# Patient Record
Sex: Female | Born: 1980 | Hispanic: No | Marital: Married | Smoking: Never smoker
Health system: Southern US, Community
[De-identification: ages and names within clinical notes are randomized; demographics above are authoritative.]

## PROBLEM LIST (undated history)

## (undated) DIAGNOSIS — D369 Benign neoplasm, unspecified site: Secondary | ICD-10-CM

## (undated) HISTORY — PX: OTHER SURGICAL HISTORY: SHX169

## (undated) HISTORY — PX: BRAIN SURGERY: SHX531

---

## 2003-08-13 ENCOUNTER — Inpatient Hospital Stay (HOSPITAL_COMMUNITY): Admission: AD | Admit: 2003-08-13 | Discharge: 2003-08-15 | Payer: Self-pay | Admitting: Obstetrics and Gynecology

## 2006-01-14 ENCOUNTER — Emergency Department (HOSPITAL_COMMUNITY): Admission: EM | Admit: 2006-01-14 | Discharge: 2006-01-14 | Payer: Self-pay | Admitting: Emergency Medicine

## 2006-12-29 ENCOUNTER — Inpatient Hospital Stay (HOSPITAL_COMMUNITY): Admission: RE | Admit: 2006-12-29 | Discharge: 2006-12-31 | Payer: Self-pay | Admitting: Obstetrics and Gynecology

## 2013-10-26 ENCOUNTER — Encounter: Payer: Self-pay | Admitting: Orthopedic Surgery

## 2013-10-26 ENCOUNTER — Ambulatory Visit (INDEPENDENT_AMBULATORY_CARE_PROVIDER_SITE_OTHER): Payer: BC Managed Care – PPO

## 2013-10-26 ENCOUNTER — Ambulatory Visit (INDEPENDENT_AMBULATORY_CARE_PROVIDER_SITE_OTHER): Payer: BC Managed Care – PPO | Admitting: Orthopedic Surgery

## 2013-10-26 VITALS — BP 110/69 | Ht 65.0 in | Wt 143.0 lb

## 2013-10-26 DIAGNOSIS — M25579 Pain in unspecified ankle and joints of unspecified foot: Secondary | ICD-10-CM

## 2013-10-26 DIAGNOSIS — S8263XA Displaced fracture of lateral malleolus of unspecified fibula, initial encounter for closed fracture: Secondary | ICD-10-CM

## 2013-10-26 MED ORDER — ACETAMINOPHEN-CODEINE #3 300-30 MG PO TABS: 1.0000 | ORAL_TABLET | ORAL | Status: DC | PRN

## 2013-10-26 NOTE — Progress Notes (Signed)
  Subjective:    Andrea Ochoa is a 33 y.o. female who presents with right ankle pain. Onset of the symptoms was 2 days ago. Inciting event: injured while walking at home . Current symptoms include: bruising, inability to bear weight, pain at the lateral aspect of the ankle, stiffness and swelling. Aggravating factors: direct pressure, standing, walking  and weight bearing. Symptoms have waxed and waned. Patient has had no prior ankle problems. Evaluation to date: plain films: abnormal lateral mall non displaced fracture . Treatment to date: none. The following portions of the patient's history were reviewed and updated as appropriate: allergies, current medications, past family history, past medical history, past social history, past surgical history and problem list.   ros is positive for fatigue and cough and anxiety patient just   delivered 2 months ago has a 6-month-old and she is breast-feeding  Exam:   BP 110/69  Ht 5\' 5"  (1.651 m)  Wt 143 lb (64.864 kg)  BMI 23.80 kg/m2 General appearance is normal she is oriented x3 mood and affect are flat ambulation nonweightbearing with crutches  Shows a swollen tender ankle I can get her foot to neutral she should be okay for Cam Walker weightbearing as tolerated ankle is stable tenderness over the lateral malleolus minimal tenderness over the medial deltoid motor exam is normal skin is intact strong dorsalis pedis pulse normal sensation and no lymphadenopathy  Right ankle:   c above   Left ankle:   normal   Imaging:  AP and lateral sent from the chiropractor's office show a lateral malleolus fracture. Repeat films including a mortise view shows a stable intact mortise the lateral malleolus fracture  Assessment:    Fracture of Lateral malleolus    Plan:    Recommend weightbearing as tolerated and a long Cam Walker with crutches x-ray in 6 weeks out of work 6 weeks to discuss medication use with pediatrician or a gynecologist regarding pain  management

## 2013-10-26 NOTE — Patient Instructions (Signed)
Weight bearing as tolerated  Remove for bathing  Ankle Fracture A fracture is a break in the bone. A cast or splint is used to protect and keep your injured bone from moving.  HOME CARE INSTRUCTIONS   Use your crutches as directed.  To lessen the swelling, keep the injured leg elevated while sitting or lying down.  Apply ice to the injury for 15-20 minutes, 03-04 times per day while awake for 2 days. Put the ice in a plastic bag and place a thin towel between the bag of ice and your cast.  If you have a plaster or fiberglass cast:  Do not try to scratch the skin under the cast using sharp or pointed objects.  Check the skin around the cast every day. You may put lotion on any red or sore areas.  Keep your cast dry and clean.  If you have a plaster splint:  Wear the splint as directed.  You may loosen the elastic around the splint if your toes become numb, tingle, or turn cold or blue.  Do not put pressure on any part of your cast or splint; it may break. Rest your cast only on a pillow the first 24 hours until it is fully hardened.  Your cast or splint can be protected during bathing with a plastic bag. Do not lower the cast or splint into water.  Take medications as directed by your caregiver. Only take over-the-counter or prescription medicines for pain, discomfort, or fever as directed by your caregiver.  Do not drive a vehicle until your caregiver specifically tells you it is safe to do so.  If your caregiver has given you a follow-up appointment, it is very important to keep that appointment. Not keeping the appointment could result in a chronic or permanent injury, pain, and disability. If there is any problem keeping the appointment, you must call back to this facility for assistance. SEEK IMMEDIATE MEDICAL CARE IF:   Your cast gets damaged or breaks.  You have continued severe pain or more swelling than you did before the cast was put on.  Your skin or toenails below  the injury turn blue or gray, or feel cold or numb.  There is a bad smell or new stains and/or purulent (pus like) drainage coming from under the cast. If you do not have a window in your cast for observing the wound, a discharge or minor bleeding may show up as a stain on the outside of your cast. Report these findings to your caregiver. MAKE SURE YOU:   Understand these instructions.  Will watch your condition.  Will get help right away if you are not doing well or get worse. Document Released: 08/22/2000 Document Revised: 11/17/2011 Document Reviewed: 03/24/2013 [facility] Patient Information 2014 [place], [place].

## 2013-12-08 ENCOUNTER — Ambulatory Visit: Payer: BC Managed Care – PPO | Admitting: Orthopedic Surgery

## 2013-12-12 ENCOUNTER — Telehealth: Payer: Self-pay | Admitting: Orthopedic Surgery

## 2013-12-12 NOTE — Telephone Encounter (Signed)
Patient called in regard to missed appointment of 12/08/13 for follow up/fracture care with Xrays of right ankle.  A letter has been done but may not have reached patient as of yet in mail.  She has re-scheduled for first available, as Dr Aline Brochure is out of office for 1 week, and is on the wait list in event of a cancellation sooner.  She mentioned being ready to return to work, however, her out of work status was entered through date of the scheduled appointment 12/08/13.  She would like to return as soon as possible.  The re-scheduled appointment is 12/26/13.  Issue out of work till this date, or may patient return for light duty now? Patient's ph# is [phone].

## 2013-12-13 NOTE — Telephone Encounter (Signed)
Keep everyhing the same

## 2013-12-14 NOTE — Telephone Encounter (Signed)
Called back to patient to relay; left voice message to relay.

## 2013-12-21 NOTE — Telephone Encounter (Signed)
On 12/20/13, I called back to patient; offered her an earlier date appointment in a cancellation slot, since she had to move her original appointment out to 12/26/13; states she would be unable to come today (12/20/13) and would keep the appointment date of 12/26/13.

## 2013-12-26 ENCOUNTER — Encounter: Payer: Self-pay | Admitting: Orthopedic Surgery

## 2013-12-26 ENCOUNTER — Ambulatory Visit (INDEPENDENT_AMBULATORY_CARE_PROVIDER_SITE_OTHER): Payer: BC Managed Care – PPO | Admitting: Orthopedic Surgery

## 2013-12-26 ENCOUNTER — Ambulatory Visit (INDEPENDENT_AMBULATORY_CARE_PROVIDER_SITE_OTHER): Payer: BC Managed Care – PPO

## 2013-12-26 VITALS — BP 110/70 | Ht 65.0 in | Wt 143.0 lb

## 2013-12-26 DIAGNOSIS — S82891A Other fracture of right lower leg, initial encounter for closed fracture: Secondary | ICD-10-CM

## 2013-12-26 DIAGNOSIS — S82899A Other fracture of unspecified lower leg, initial encounter for closed fracture: Secondary | ICD-10-CM

## 2013-12-26 DIAGNOSIS — S8263XA Displaced fracture of lateral malleolus of unspecified fibula, initial encounter for closed fracture: Secondary | ICD-10-CM

## 2013-12-26 NOTE — Progress Notes (Signed)
Patient ID: Andrea Ochoa, female   DOB: 02/17/1981, 33 y.o.   MRN: [id number] On the in her Chief Complaint  Patient presents with  . Follow-up    6 week recheck Right Fractured lateral malleolus DOI 10/24/13   Fracture care followup  Returned to work this week in  a [employer]  No pain or tenderness at this time  X-rays show fracture healing  Remove brace, weightbearing as tolerated follow up when necessary

## 2013-12-26 NOTE — Patient Instructions (Signed)
She has returned to work already

## 2015-06-05 ENCOUNTER — Other Ambulatory Visit (HOSPITAL_COMMUNITY): Payer: Self-pay | Admitting: Preventative Medicine

## 2015-06-05 DIAGNOSIS — G44029 Chronic cluster headache, not intractable: Secondary | ICD-10-CM

## 2015-06-12 ENCOUNTER — Ambulatory Visit (HOSPITAL_COMMUNITY): Payer: Self-pay

## 2015-07-13 ENCOUNTER — Ambulatory Visit (HOSPITAL_COMMUNITY): Payer: 59

## 2015-08-14 ENCOUNTER — Ambulatory Visit (HOSPITAL_COMMUNITY)
Admission: RE | Admit: 2015-08-14 | Discharge: 2015-08-14 | Disposition: A | Discharge status: Home / Self Care | Payer: 59 | Source: Ambulatory Visit | Attending: Preventative Medicine | Admitting: Preventative Medicine

## 2015-08-14 DIAGNOSIS — R930 Abnormal findings on diagnostic imaging of skull and head, not elsewhere classified: Secondary | ICD-10-CM | POA: Diagnosis not present

## 2015-08-14 DIAGNOSIS — G44029 Chronic cluster headache, not intractable: Secondary | ICD-10-CM | POA: Diagnosis not present

## 2015-08-14 MED ORDER — GADOBENATE DIMEGLUMINE 529 MG/ML IV SOLN
13.0000 mL | Freq: Once | INTRAVENOUS | Status: AC | PRN
  Administered 2015-08-14: 13 mL via INTRAVENOUS

## 2016-11-08 IMAGING — MR MR HEAD WO/W CM
6 of 13 series · 23 of 48 positions shown · IV contrast (multihance)
Comparison: None.

CLINICAL DATA: Right temporal headache.  Palpable mass.

EXAM:
MRI HEAD WITHOUT AND WITH CONTRAST
TECHNIQUE: Multiplanar, multiecho pulse sequences of the brain and surrounding
structures were obtained without and with intravenous contrast.
CONTRAST:  13mL MULTIHANCE GADOBENATE DIMEGLUMINE 529 MG/ML IV SOLN

[Series 5: T2 · axial · 5.0mm · 0.51mm/px · z∈[-94,+49]mm · 2 of 23 slices shown (1 of 2)]
[im 1/23]
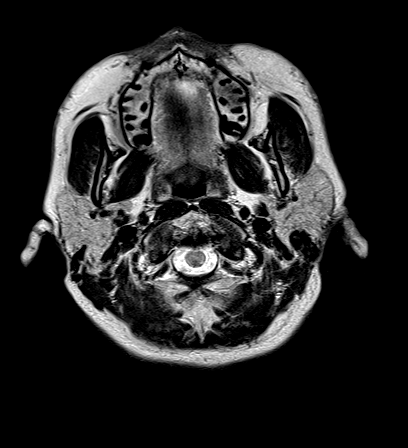
[im 23/23]
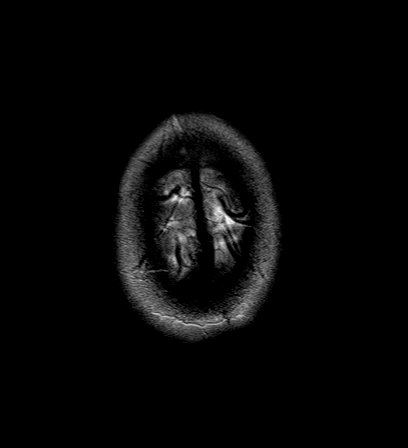

[Series 6: FLAIR · axial · 5.0mm · 0.38mm/px · z∈[-94,+48]mm · 2 of 23 slices shown]
[im 1/23]
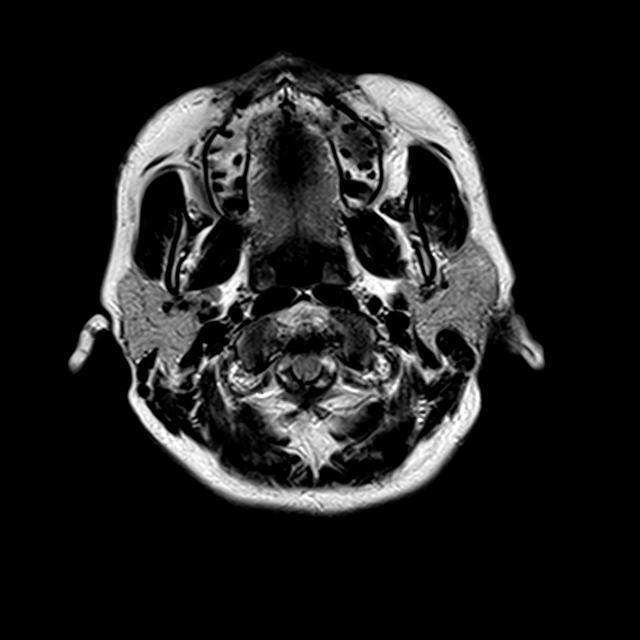
[im 23/23]
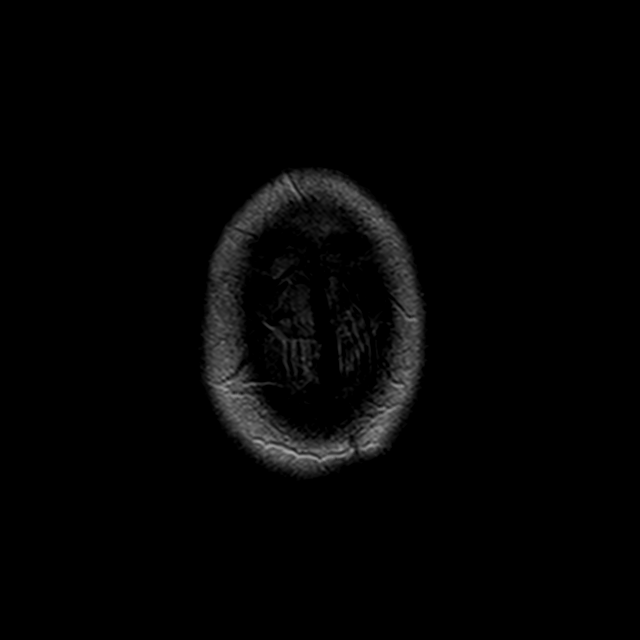

[Series 7: T1 · axial · 2.0mm · 0.45mm/px · z∈[-113,+52]mm · 7 of 98 slices shown]
[im 1/98]
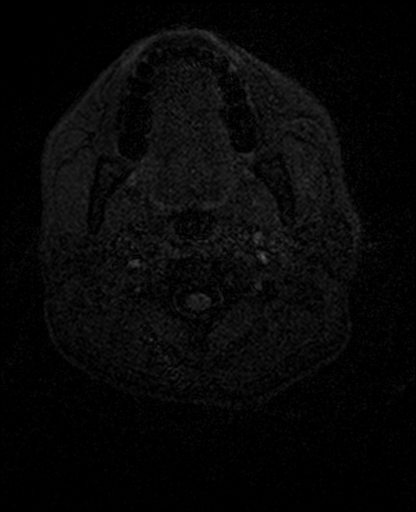
[im 14/98]
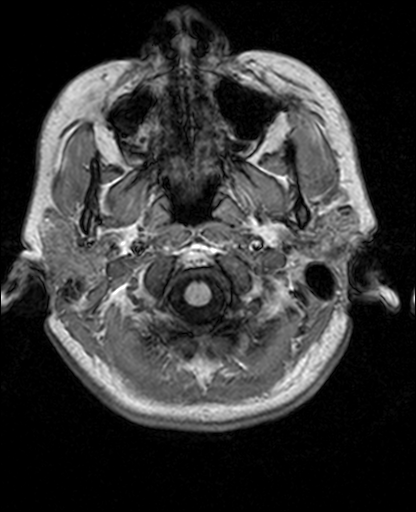
[im 28/98]
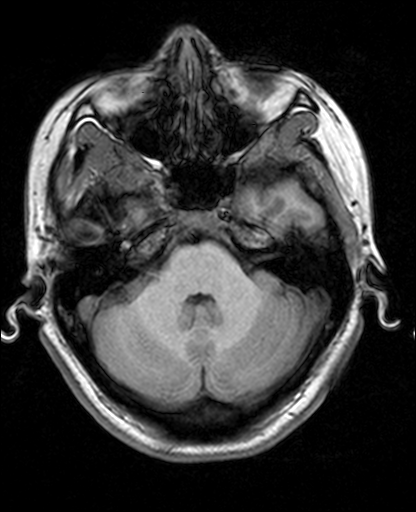
[im 42/98]
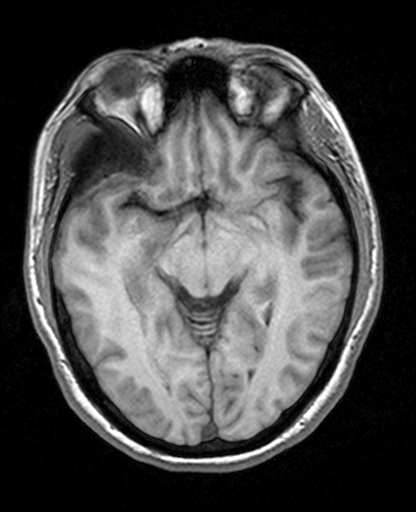
[im 56/98]
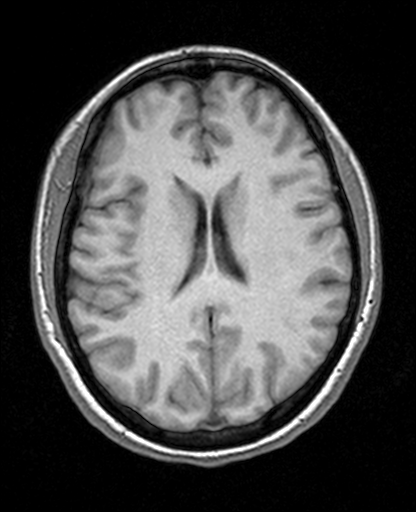
[im 70/98]
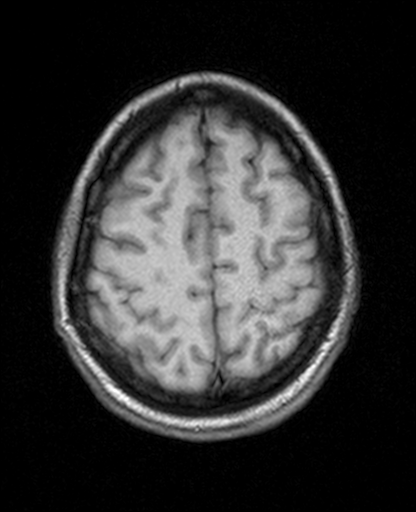
[im 84/98]
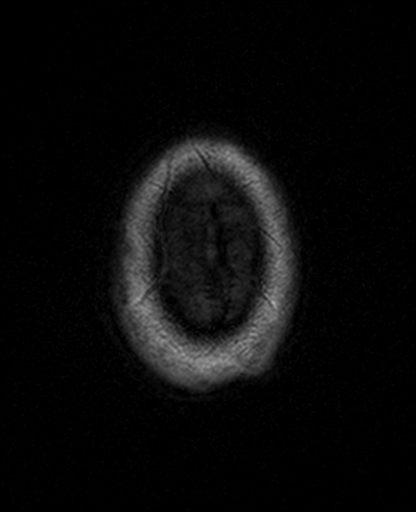

[Series 9: T2 · coronal · 5.0mm · 0.70mm/px · 2 of 24 slices shown (2 of 2)]
[im 1/24]
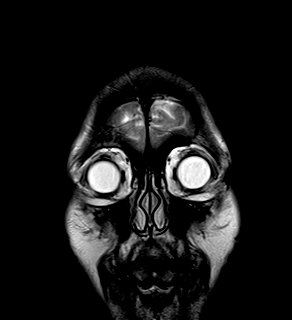
[im 24/24]
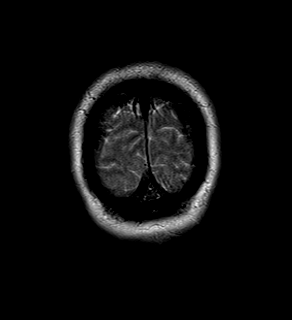

[Series 10: T1 post-contrast · axial · 2.0mm · 0.45mm/px · z∈[-113,+80]mm · 8 of 98 slices shown (1 of 2)]
[im 1/98]
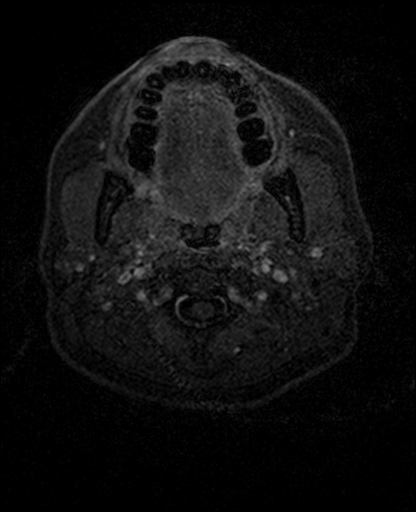
[im 14/98]
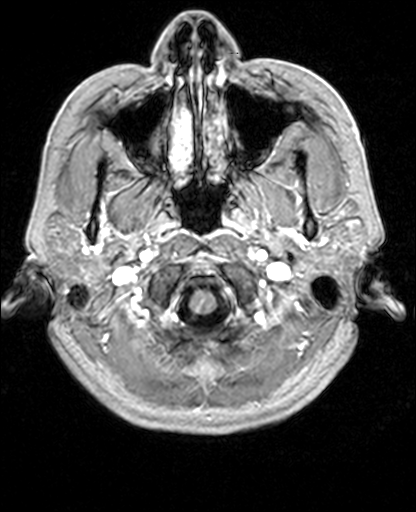
[im 28/98]
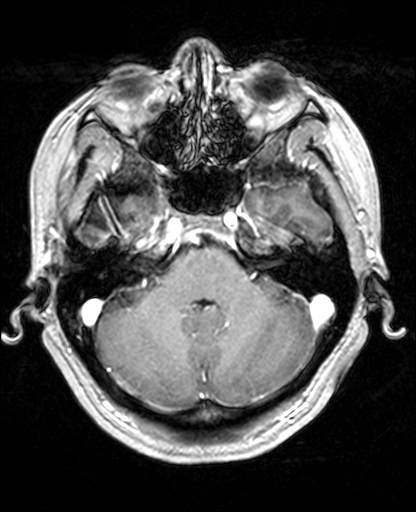
[im 42/98]
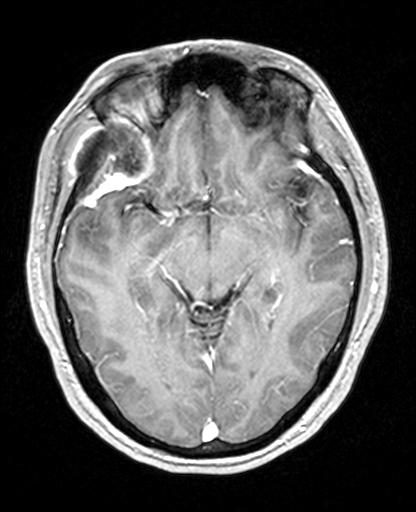
[im 56/98]
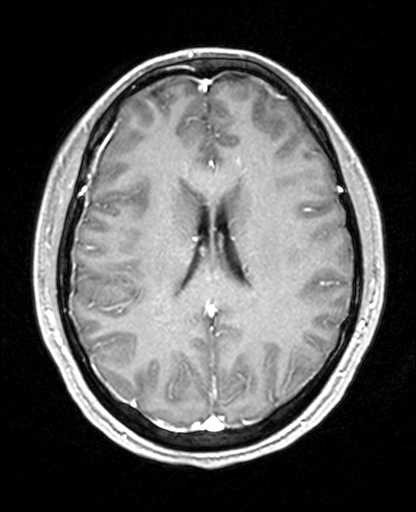
[im 70/98]
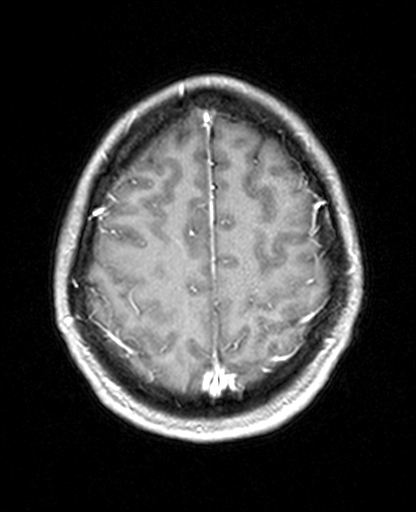
[im 84/98]
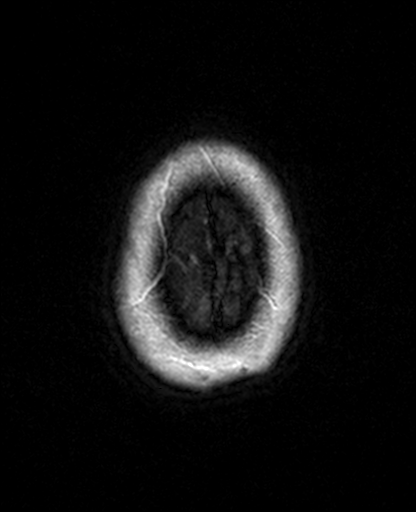
[im 98/98]
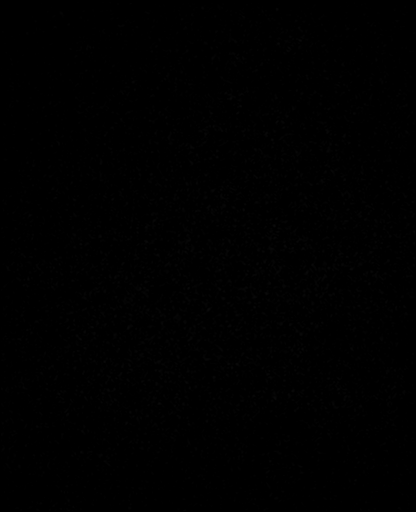

[Series 11: T1 post-contrast · coronal · 5.0mm · 0.44mm/px · 2 of 24 slices shown (2 of 2)]
[im 1/24]
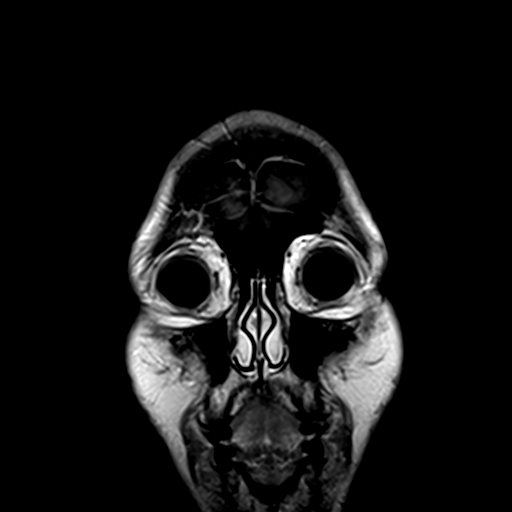
[im 24/24]
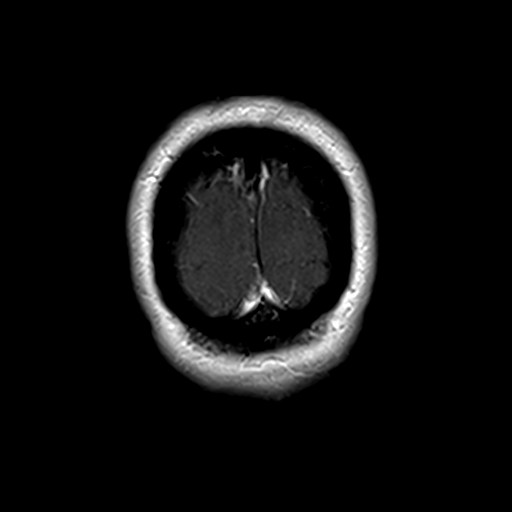

[23 of 48 positions shown; findings below may reference images not displayed]

FINDINGS: There is bony thickening of the greater wing of the sphenoid on the
right extending into the soft tissues of the temporalis muscle as
well as into the middle cranial fossa. There is also some thickening
of the orbital roof. Postcontrast reveals mild enhancement of the
thickened bone. There is plaque-like enhancement of the dura in the
area of the greater wing of the sphenoid measuring approximately 6 x
16 mm. No edema in the adjacent brain. There is mass-effect on the
adjacent brain.

Ventricle size normal.  No shift of the midline structures.

Negative for acute infarct.  No significant chronic ischemia.

Postcontrast imaging reveals no additional areas of abnormal
enhancement.

Mild mucosal edema in the paranasal sinuses. Normal orbit. Pituitary
normal in size
IMPRESSION: Bony thickening of the greater wing of the sphenoid on the right
extending into the right temporalis muscle. The bone shows mild
enhancement postcontrast. There is a 6 x 16 mm area of plaque-like
dural enhancement medial and posterior to the area of bony
thickening. This likely represents meningioma with intraosseous
component. Primary bone neoplasm less likely. Recommend CT of the
head without with contrast to evaluate for any possible aggressive
bony characteristics.

## 2017-10-09 ENCOUNTER — Encounter (HOSPITAL_COMMUNITY): Payer: Self-pay

## 2017-10-09 ENCOUNTER — Emergency Department (HOSPITAL_COMMUNITY)
Admission: EM | Admit: 2017-10-09 | Discharge: 2017-10-09 | Disposition: A | Discharge status: Home / Self Care | Payer: Self-pay | Attending: Emergency Medicine | Admitting: Emergency Medicine

## 2017-10-09 DIAGNOSIS — J111 Influenza due to unidentified influenza virus with other respiratory manifestations: Secondary | ICD-10-CM | POA: Insufficient documentation

## 2017-10-09 DIAGNOSIS — R69 Illness, unspecified: Secondary | ICD-10-CM

## 2017-10-09 MED ORDER — OSELTAMIVIR PHOSPHATE 75 MG PO CAPS: 75.0000 mg | ORAL_CAPSULE | Freq: Two times a day (BID) | ORAL | 0 refills | Status: AC

## 2017-10-09 NOTE — ED Provider Notes (Signed)
[facility] EMERGENCY DEPARTMENT Provider Note   CSN: [id number] Arrival date & time: 10/09/17  2025     History   Chief Complaint Chief Complaint  Patient presents with  . Cough  . Generalized Body Aches    HPI Andrea Ochoa is a 37 y.o. female.  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  She has now developed some body aches as well.  She denies a fever, she has not had any medications prior to arrival as she found out several weeks ago that she was pregnant.  She denies abdominal pain, she does endorse coughing which is nonproductive, she has had a headache, she is concerned that she may have an infection.  She has no swelling of the legs.  No trauma, or recent surgery.  History reviewed. No pertinent past medical history.  Patient Active Problem List   Diagnosis Date Noted  . Fractured lateral malleolus 10/26/2013  . Pain in joint, ankle and foot 10/26/2013    Past Surgical History:  Procedure Laterality Date  . 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 Authorizing Provider  acetaminophen-codeine (TYLENOL #3) 300-30 MG per tablet Take 1 tablet by mouth every 4 (four) hours as needed for moderate pain. 10/26/13   Carole Civil, MD  ibuprofen (ADVIL,MOTRIN) 800 MG tablet Take 800 mg by mouth every 8 (eight) hours as needed.    [provider]    Family History Family History  Problem Relation Age of Onset  . Cancer Other        + family hx  . Anesthesia problems Neg Hx     . Broken bones Neg Hx   . Clotting disorder Neg Hx   . Collagen disease Neg Hx   . Diabetes Neg Hx   . Dislocations Neg Hx   . Osteoporosis Neg Hx   . Rheumatologic disease Neg Hx   . Scoliosis Neg Hx   . Severe sprains Neg Hx     Social History Social History   Tobacco Use  . Smoking status: Never Smoker  Substance Use Topics  . Alcohol use: No  . Drug use: No     Allergies   Patient has no known allergies.   Review of Systems Review of Systems  All other systems reviewed and are negative.    Physical Exam Updated Vital Signs BP 119/75 (BP Location: Left Arm)   Pulse (!) 111   Temp 98.3 F (36.8 C) (Oral)   Resp 20   Ht 5\' 5"  (1.651 m)   Wt 72.6 kg (160 lb)   LMP 08/12/2017   SpO2 100%   BMI 26.63 kg/m   Physical Exam  Constitutional: She appears well-developed and well-nourished. No distress.  HENT:  Head: Normocephalic and atraumatic.  Mouth/Throat: Oropharynx is clear  and moist. No oropharyngeal exudate.  Eyes: Conjunctivae and EOM are normal. Pupils are equal, round, and reactive to light. Right eye exhibits no discharge. Left eye exhibits no discharge. No scleral icterus.  Neck: Normal range of motion. Neck supple. No JVD present. No thyromegaly present.  Cardiovascular: Normal rate, regular rhythm, normal heart sounds and intact distal pulses. Exam reveals no gallop and no friction rub.  No murmur heard. Pulmonary/Chest: Effort normal and breath sounds normal. No respiratory distress. She has no wheezes. She has no rales.  Abdominal: Soft. Bowel sounds are normal. She exhibits no distension and no mass. There is no tenderness.  Musculoskeletal: Normal range of motion. She exhibits no edema or tenderness.  Lymphadenopathy:    She has no cervical adenopathy.  Neurological: She is alert. Coordination normal.  Skin: Skin is warm and dry. No rash noted. No erythema.  Psychiatric: She has a normal mood and affect. Her behavior is normal.  Nursing note  and vitals reviewed.    ED Treatments / Results  Labs (all labs ordered are listed, but only abnormal results are displayed) Labs Reviewed - No data to display   Radiology No results found.  Procedures Procedures (including critical care time)  Medications Ordered in ED Medications - No data to display   Initial Impression / Assessment and Plan / ED Course  I have reviewed the triage vital signs and the nursing notes.  Pertinent labs & imaging results that were available during my care of the patient were reviewed by me and considered in my medical decision making (see chart for details).     The patient's exam is unremarkable including her pharyngeal exam without any erythema or exudate, her abdominal exam which is nontender, her lung exam which is clear with no rales wheezing rhonchi or increased work of breathing.  She has normal oxygen saturation at 100% on room air, her pulse is between 90 and 100 and she is no edema of the legs.  Despite having recent travel to [place], she has symptoms that are far more concerning for upper respiratory infection or possibly the flu especially given her recent onset of work in a nursing facility.  We will keep her out of work, she will be started on Tamiflu due to her pregnancy in less than 24 hours of symptoms and I recommended Tylenol for body aches.  She can follow-up with the pharmacist for better recommendations on over-the-counter cough medications as needed.  I do not think she needs an x-ray nor a workup for pulmonary embolism at this time   Final Clinical Impressions(s) / ED Diagnoses   Final diagnoses:  Influenza-like illness    ED Discharge Orders        Ordered    oseltamivir (TAMIFLU) 75 MG capsule  Every 12 hours     10/09/17 1002       Noemi Chapel, MD 10/09/17 1003

## 2017-10-09 NOTE — ED Triage Notes (Signed)
edp at bedside  

## 2017-10-09 NOTE — ED Triage Notes (Signed)
Pt reports cough, chest pain with coughing, headache, and generalized body aches since last night.  Pt also says had positive home pregnancy test but hasn't seen an OBGYN yet.  Reports lmp was 08/12/2017

## 2017-10-09 NOTE — Discharge Instructions (Signed)
Return to the emergency department for increasing chest pain, difficulty breathing or high fevers.  It appears that you likely have a flu type illness which may last for the better part of 1 week.   You should talk with your pharmacist about possibilities for coughing medications that are safe in pregnancy Tamiflu, 75 mg twice a day for the next 5 days  Please obtain all of your results from medical records or have your doctors office obtain the results - share them with your doctor - you should be seen at your doctors office in the next 2 days. Call today to arrange your follow up. Take the medications as prescribed. Please review all of the medicines and only take them if you do not have an allergy to them. Please be aware that if you are taking birth control pills, taking other prescriptions, ESPECIALLY ANTIBIOTICS may make the birth control ineffective - if this is the case, either do not engage in sexual activity or use alternative methods of birth control such as condoms until you have finished the medicine and your family doctor says it is OK to restart them. If you are on a blood thinner such as COUMADIN, be aware that any other medicine that you take may cause the coumadin to either work too much, or not enough - you should have your coumadin level rechecked in next 7 days if this is the case.  ?  It is also a possibility that you have an allergic reaction to any of the medicines that you have been prescribed - Everybody reacts differently to medications and while MOST people have no trouble with most medicines, you may have a reaction such as nausea, vomiting, rash, swelling, shortness of breath. If this is the case, please stop taking the medicine immediately and contact your physician.  ?  You should return to the ER if you develop severe or worsening symptoms.

## 2017-11-19 ENCOUNTER — Other Ambulatory Visit: Payer: Self-pay

## 2017-11-26 ENCOUNTER — Other Ambulatory Visit (HOSPITAL_COMMUNITY): Payer: Self-pay | Admitting: Nurse Practitioner

## 2017-11-26 ENCOUNTER — Encounter (HOSPITAL_COMMUNITY): Payer: Self-pay | Admitting: Nurse Practitioner

## 2017-11-26 DIAGNOSIS — O289 Unspecified abnormal findings on antenatal screening of mother: Secondary | ICD-10-CM

## 2017-12-01 ENCOUNTER — Encounter (HOSPITAL_COMMUNITY): Payer: Self-pay | Admitting: *Deleted

## 2017-12-02 ENCOUNTER — Other Ambulatory Visit (HOSPITAL_COMMUNITY): Payer: Self-pay | Admitting: *Deleted

## 2017-12-02 ENCOUNTER — Encounter (HOSPITAL_COMMUNITY): Payer: Self-pay

## 2017-12-02 ENCOUNTER — Ambulatory Visit (HOSPITAL_COMMUNITY)
Admission: RE | Admit: 2017-12-02 | Discharge: 2017-12-02 | Disposition: A | Discharge status: Home / Self Care | Payer: Self-pay | Source: Ambulatory Visit | Attending: Nurse Practitioner | Admitting: Nurse Practitioner

## 2017-12-02 ENCOUNTER — Other Ambulatory Visit: Payer: Self-pay

## 2017-12-02 ENCOUNTER — Other Ambulatory Visit (HOSPITAL_COMMUNITY): Payer: Self-pay | Admitting: Nurse Practitioner

## 2017-12-02 DIAGNOSIS — Z3689 Encounter for other specified antenatal screening: Secondary | ICD-10-CM

## 2017-12-02 DIAGNOSIS — O289 Unspecified abnormal findings on antenatal screening of mother: Secondary | ICD-10-CM

## 2017-12-02 DIAGNOSIS — Z3A17 17 weeks gestation of pregnancy: Secondary | ICD-10-CM

## 2017-12-02 DIAGNOSIS — O09522 Supervision of elderly multigravida, second trimester: Secondary | ICD-10-CM

## 2017-12-02 DIAGNOSIS — Z362 Encounter for other antenatal screening follow-up: Secondary | ICD-10-CM

## 2017-12-02 DIAGNOSIS — O281 Abnormal biochemical finding on antenatal screening of mother: Secondary | ICD-10-CM | POA: Insufficient documentation

## 2017-12-02 HISTORY — DX: Benign neoplasm, unspecified site: D36.9

## 2017-12-02 NOTE — Progress Notes (Signed)
Genetic Counseling  Visit Summary Note  Appointment Date: 12/02/2017 Referred By: Tylene Fantasia  Date of Birth: [birth year]  Pregnancy history: [id number] Estimated Date of Delivery: 05/10/18 Estimated Gestational Age: [redacted]w[redacted]d  Andrea Ochoa was seen for genetic counseling because of a maternal age of 37 y.o..     In summary:  Discussed increased risk for fetal aneuploidy with advanced maternal age and abnormal Quad screen  Reviewed results of patient's NIPS and ultrasound  Discussed limitations of screening and option of diagnostic testing  Declined amniocentesis  Reviewed family history concerns - no concerns identified  Discussed carrier screening options - declined  CF  SMA  Hemoglobinopathies  She was counseled regarding maternal age and the association with risk for chromosome conditions due to nondisjunction with aging of the ova.   We reviewed chromosomes, nondisjunction, and the associated 1 in 70 risk for fetal aneuploidy related to a maternal age of 37 y.o. at [redacted]w[redacted]d gestation.  She was counseled that the risk for aneuploidy decreases as gestational age increases, accounting for those pregnancies which spontaneously abort.  We specifically discussed Down syndrome (trisomy 61), trisomies 49 and 44, and sex chromosome aneuploidies (47,XXX and 47,XXY) including the common features and prognoses of each.   We also reviewed the results of Andrea Ochoa's Quad screen, non-invasive prenatal screening (NIPS) and ultrasound.  She was counseled that typically when a patient has a cell free DNA screen, an "AFP only" is ordered in the 2nd trimester to screen for open neural tube defects, but the other analytes that make up the "Quad screen" are not assessed because they screen for fetal aneuploidy, which was already performed through the NIPS.  Unfortunately, instead of an AFP only, she had a Quad screen.  She was counseled regarding the Quad screen result and the associated 1 in 142  risk for fetal Down syndrome.  We discussed that a Quad screen has a detection rate of 60-65% for Trisomy 18 and 75-80% for Down syndrome.  In addition, the Quad screen has a false positive rate of up to 5%.  We discussed that NIPS analyzes placental cell free DNA in maternal circulation to evaluate for the presence of extra chromosome conditions.  Thus, it is able to provide risk assessment for specific chromosome conditions, but still is not diagnostic.  Based on the [facility] screen, the chance for her baby to have aneuploidy for chromosomes 13, 18, 21 or X was reduced to less than 1 in 10000.  She was also counseled that 50-80% of fetuses with Down syndrome and up to 90% of fetuses with trisomies 56 and 18, when well visualized, have detectable anomalies or soft markers by ultrasound.  We discussed that her ultrasound was limited due to early gestation, but no specific concerns were identified at this time.    We also discussed the availability of diagnostic testing by way of amniocentesis.  We reviewed the risks, benefits and limitations of amniocentesis including the approximate 1 in 300-500 risk for pregnancy complications following amniocentesis. We discussed the possible results that the tests might provide including: positive, negative, unanticipated, and no result.   After reviewing the above results and the available options, Andrea Ochoa expressed that she is not interested in pursuing diagnostic testing at this time or in the future, given the associated risk of complications.  She understands that ultrasound and NIPS cannot rule out all birth defects or genetic syndromes.    Andrea Ochoa was provided with written information regarding cystic fibrosis (CF),  spinal muscular atrophy (SMA) and hemoglobinopathies including the carrier frequency, availability of carrier screening and prenatal diagnosis if indicated.  In addition, we discussed that CF and hemoglobinopathies are routinely screened for as  part of the [place] newborn screening panel.  After further discussion, she declined screening for CF, SMA and hemoglobinopathies.  Both family histories were reviewed and found to be noncontributory for birth defects, mental retardation, and known genetic conditions. Without further information regarding the provided family history, an accurate genetic risk cannot be calculated. Further genetic counseling is warranted if more information is obtained.  Andrea Ochoa denied exposure to environmental toxins or chemical agents. She denied the use of alcohol, tobacco or street drugs. She denied significant viral illnesses during the course of her pregnancy. Her medical and surgical histories were noncontributory.   Most of the counseling was provided by Lady Deutscher, [employer] genetic counseling student, under my direct supervision. We counseled Andrea Ochoa regarding the above risks and available options.  The approximate face-to-face time with the genetic counselor was 60 minutes.  Cam Hai, MS,  Certified Genetic Counselor

## 2017-12-02 NOTE — Addendum Note (Signed)
Encounter addended by: Novella Rob, RDMS on: 12/02/2017 11:12 AM  Actions taken: Imaging Exam ended

## 2017-12-31 ENCOUNTER — Ambulatory Visit (HOSPITAL_COMMUNITY)
Admission: RE | Admit: 2017-12-31 | Discharge: 2017-12-31 | Disposition: A | Discharge status: Home / Self Care | Payer: PRIVATE HEALTH INSURANCE | Source: Ambulatory Visit | Attending: Nurse Practitioner | Admitting: Nurse Practitioner

## 2017-12-31 ENCOUNTER — Encounter (HOSPITAL_COMMUNITY): Payer: Self-pay

## 2017-12-31 DIAGNOSIS — O09522 Supervision of elderly multigravida, second trimester: Secondary | ICD-10-CM | POA: Insufficient documentation

## 2017-12-31 DIAGNOSIS — Z3A21 21 weeks gestation of pregnancy: Secondary | ICD-10-CM | POA: Insufficient documentation

## 2017-12-31 DIAGNOSIS — Z362 Encounter for other antenatal screening follow-up: Secondary | ICD-10-CM | POA: Diagnosis not present

## 2018-01-16 ENCOUNTER — Emergency Department (HOSPITAL_COMMUNITY)
Admission: EM | Admit: 2018-01-16 | Discharge: 2018-01-16 | Disposition: A | Discharge status: Home / Self Care | Payer: PRIVATE HEALTH INSURANCE | Attending: Emergency Medicine | Admitting: Emergency Medicine

## 2018-01-16 ENCOUNTER — Encounter (HOSPITAL_COMMUNITY): Payer: Self-pay

## 2018-01-16 ENCOUNTER — Other Ambulatory Visit: Payer: Self-pay

## 2018-01-16 DIAGNOSIS — Y939 Activity, unspecified: Secondary | ICD-10-CM | POA: Diagnosis not present

## 2018-01-16 DIAGNOSIS — Z3A23 23 weeks gestation of pregnancy: Secondary | ICD-10-CM | POA: Insufficient documentation

## 2018-01-16 DIAGNOSIS — Y33XXXA Other specified events, undetermined intent, initial encounter: Secondary | ICD-10-CM | POA: Insufficient documentation

## 2018-01-16 DIAGNOSIS — R1031 Right lower quadrant pain: Secondary | ICD-10-CM | POA: Insufficient documentation

## 2018-01-16 DIAGNOSIS — Y998 Other external cause status: Secondary | ICD-10-CM | POA: Diagnosis not present

## 2018-01-16 DIAGNOSIS — S39011A Strain of muscle, fascia and tendon of abdomen, initial encounter: Secondary | ICD-10-CM

## 2018-01-16 DIAGNOSIS — Y92129 Unspecified place in nursing home as the place of occurrence of the external cause: Secondary | ICD-10-CM | POA: Diagnosis not present

## 2018-01-16 DIAGNOSIS — O9989 Other specified diseases and conditions complicating pregnancy, childbirth and the puerperium: Secondary | ICD-10-CM | POA: Diagnosis present

## 2018-01-16 DIAGNOSIS — Z79899 Other long term (current) drug therapy: Secondary | ICD-10-CM | POA: Diagnosis not present

## 2018-01-16 DIAGNOSIS — R1030 Lower abdominal pain, unspecified: Secondary | ICD-10-CM

## 2018-01-16 MED ORDER — ACETAMINOPHEN 500 MG PO TABS
1000.0000 mg | ORAL_TABLET | Freq: Once | ORAL | Status: AC
  Administered 2018-01-16: 1000 mg via ORAL
  Filled 2018-01-16: qty 2

## 2018-01-16 NOTE — ED Provider Notes (Signed)
[facility] EMERGENCY DEPARTMENT Provider Note   CSN: [id number] Arrival date & time: 01/16/18  1029     History   Chief Complaint Chief Complaint  Patient presents with  . Groin Pain    HPI Andrea Ochoa is a 37 y.o. female.   Groin Pain     The pt is a 37 y/o G 4 P 3 female at [redacted] weeks gestation with healthy pregnancy - was working at the [employer] where she is employed this morning when a resident at the [employer] started to choke on food - she tried to rescucitate him,, tried to stimulate him, tried to help him stop choking, and he went unresponsive.  She was very upset and concerned, she tried very hard to get him to stop choking and to start breathing.  Finally a nurse came over and helped and the patient was successfully resuscitated back to baseline however shortly thereafter the patient had some discomfort in the right groin which then translated over onto the left side as well.  She reports that at this time her symptoms are much better, they are worse when she tries to move around and they are not associated with any vaginal bleeding, difficulty urinating, nausea vomiting headaches blurred vision or any other complaints.  She reports that she has had a normal ultrasound with this pregnancy, she is low risk, she does not take any other medications other than a prenatal vitamin.  Past Medical History:  Diagnosis Date  . Benign tumor    rt side of head    Patient Active Problem List   Diagnosis Date Noted  . [redacted] weeks gestation of pregnancy   . Advanced maternal age in multigravida, second trimester   . Fractured lateral malleolus 10/26/2013  . Pain in joint, ankle and foot 10/26/2013    Past Surgical History:  Procedure Laterality Date  . OTHER SURGICAL HISTORY     biopsy of tumor to r temple area     OB History    Gravida  4   Para  3   Term  3   Preterm      AB      Living  3     SAB      TAB      Ectopic      Multiple      Live Births              Home Medications    Prior to Admission medications   Medication Sig Start Date End Date Taking? Authorizing Provider  oseltamivir (TAMIFLU) 75 MG capsule Take 1 capsule (75 mg total) by mouth every 12 (twelve) hours. Patient not taking: Reported on 12/02/2017 10/09/17   Noemi Chapel, MD  Prenatal Vit-Fe Fumarate-FA (PRENATAL VITAMIN PO) Take by mouth.    [provider]    Family History Family History  Problem Relation Age of Onset  . Cancer Other        + family hx  . Anesthesia problems Neg Hx   . Broken bones Neg Hx   . Clotting disorder Neg Hx   . Collagen disease Neg Hx   . Diabetes Neg Hx   . Dislocations Neg Hx   . Osteoporosis Neg Hx   . Rheumatologic disease Neg Hx   . Scoliosis Neg Hx   . Severe sprains Neg Hx     Social History Social History   Tobacco Use  . Smoking status: Never Smoker  . Smokeless tobacco: Never Used  Substance Use Topics  . Alcohol use: No  . Drug use: No     Allergies   Patient has no known allergies.   Review of Systems Review of Systems  All other systems reviewed and are negative.    Physical Exam Updated Vital Signs BP 129/78   Pulse 86   Temp 98.1 F (36.7 C) (Oral)   Resp 20   LMP 08/12/2017   SpO2 98%   Physical Exam  Constitutional: She appears well-developed and well-nourished. No distress.  HENT:  Head: Normocephalic and atraumatic.  Mouth/Throat: Oropharynx is clear and moist. No oropharyngeal exudate.  Eyes: Pupils are equal, round, and reactive to light. Conjunctivae and EOM are normal. Right eye exhibits no discharge. Left eye exhibits no discharge. No scleral icterus.  Neck: Normal range of motion. Neck supple. No JVD present. No thyromegaly present.  Cardiovascular: Normal rate, regular rhythm, normal heart sounds and intact distal pulses. Exam reveals no gallop and no friction rub.  No murmur heard. Pulmonary/Chest: Effort normal and breath sounds normal. No respiratory distress.  She has no wheezes. She has no rales.  Abdominal: Soft. Bowel sounds are normal. She exhibits no distension and no mass. There is no tenderness.  abdm with mild bil lateral abd ttp - no guarding, very soft - uterus palpated just above the Umbilicus.  No uterine ttp  Musculoskeletal: Normal range of motion. She exhibits no edema or tenderness.  Lymphadenopathy:    She has no cervical adenopathy.  Neurological: She is alert. Coordination normal.  Skin: Skin is warm and dry. No rash noted. No erythema.  Psychiatric: She has a normal mood and affect. Her behavior is normal.  Nursing note and vitals reviewed.    ED Treatments / Results  Labs (all labs ordered are listed, but only abnormal results are displayed) Labs Reviewed - No data to display  EKG None  Radiology No results found.  Procedures Procedures (including critical care time)  Medications Ordered in ED Medications  acetaminophen (TYLENOL) tablet 1,000 mg (has no administration in time range)     Initial Impression / Assessment and Plan / ED Course  I have reviewed the triage vital signs and the nursing notes.  Pertinent labs & imaging results that were available during my care of the patient were reviewed by me and considered in my medical decision making (see chart for details).    Low risk, she denies any injury to the abd. Baby looks active on [place] with [place] of 158 on my exam. Pt give reassurance No need for observation Warning for return given Tylenol encouraged for pain Pt agreeable.  Final Clinical Impressions(s) / ED Diagnoses   Final diagnoses:  Lower abdominal pain  Abdominal muscle strain, initial encounter    ED Discharge Orders    None       Noemi Chapel, MD 01/16/18 1100

## 2018-01-16 NOTE — ED Triage Notes (Signed)
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  Pt says now having pain in r and left groin area.  Reports is [redacted] weeks pregnant.  Denies any vaginal bleeding or discharge.  Pt says pain is worse when she sits down.

## 2018-01-16 NOTE — Discharge Instructions (Signed)
Tylenol for pain ER for increased pain, vomiting, fevers, vaginal bleeding or leakage of fluids ER if you feel that the baby is not moving See your OBGYN this week for a recheck

## 2018-08-02 ENCOUNTER — Encounter (HOSPITAL_COMMUNITY): Payer: Self-pay

## 2020-07-17 ENCOUNTER — Other Ambulatory Visit: Payer: Self-pay

## 2020-07-17 ENCOUNTER — Encounter (HOSPITAL_COMMUNITY): Payer: Self-pay | Admitting: *Deleted

## 2020-07-17 ENCOUNTER — Emergency Department (HOSPITAL_COMMUNITY)
Admission: EM | Admit: 2020-07-17 | Discharge: 2020-07-17 | Disposition: A | Discharge status: Home / Self Care | Payer: PRIVATE HEALTH INSURANCE | Attending: Emergency Medicine | Admitting: Emergency Medicine

## 2020-07-17 DIAGNOSIS — B373 Candidiasis of vulva and vagina: Secondary | ICD-10-CM | POA: Insufficient documentation

## 2020-07-17 DIAGNOSIS — Z86011 Personal history of benign neoplasm of the brain: Secondary | ICD-10-CM | POA: Insufficient documentation

## 2020-07-17 DIAGNOSIS — B3731 Acute candidiasis of vulva and vagina: Secondary | ICD-10-CM

## 2020-07-17 LAB — URINALYSIS, ROUTINE W REFLEX MICROSCOPIC
Bacteria, UA: NONE SEEN
Bilirubin Urine: NEGATIVE
Glucose, UA: NEGATIVE mg/dL
Ketones, ur: NEGATIVE mg/dL
Nitrite: NEGATIVE
Protein, ur: NEGATIVE mg/dL
Specific Gravity, Urine: 1.009 (ref 1.005–1.030)
pH: 6 (ref 5.0–8.0)

## 2020-07-17 LAB — WET PREP, GENITAL
Clue Cells Wet Prep HPF POC: NONE SEEN
Sperm: NONE SEEN
Trich, Wet Prep: NONE SEEN

## 2020-07-17 LAB — HIV ANTIBODY (ROUTINE TESTING W REFLEX): HIV Screen 4th Generation wRfx: NONREACTIVE

## 2020-07-17 MED ORDER — FLUCONAZOLE 200 MG PO TABS
200.0000 mg | ORAL_TABLET | Freq: Every day | ORAL | 0 refills | Status: AC
Start: 2020-07-17 — End: 2020-07-18

## 2020-07-17 NOTE — ED Provider Notes (Signed)
[facility] EMERGENCY DEPARTMENT Provider Note   CSN: [id number] Arrival date & time: 07/17/20  1032     History Chief Complaint  Patient presents with  . Vaginal itching    Andrea Ochoa is a 39 y.o. female.  39 year old female presents with complaint of vaginal itching for the past few days.  Patient reports intercourse 1 week ago with new partner, states the condom came off which concerned her and she has been using multiple times of soap since then trying to prevent infection, now with itching and irritation present.  Reports normal vaginal discharge, denies pelvic or abdominal pain, nausea, vomiting, changes in bowel or bladder habits.  No other complaints or concerns.        Past Medical History:  Diagnosis Date  . Benign tumor    rt side of head    Patient Active Problem List   Diagnosis Date Noted  . [redacted] weeks gestation of pregnancy   . Advanced maternal age in multigravida, second trimester   . Fractured lateral malleolus 10/26/2013  . Pain in joint, ankle and foot 10/26/2013    Past Surgical History:  Procedure Laterality Date  . BRAIN SURGERY    . OTHER SURGICAL HISTORY     biopsy of tumor to r temple area     OB History    Gravida  4   Para  3   Term  3   Preterm      AB      Living  3     SAB      TAB      Ectopic      Multiple      Live Births              Family History  Problem Relation Age of Onset  . Cancer Other        + family hx  . Anesthesia problems Neg Hx   . Broken bones Neg Hx   . Clotting disorder Neg Hx   . Collagen disease Neg Hx   . Diabetes Neg Hx   . Dislocations Neg Hx   . Osteoporosis Neg Hx   . Rheumatologic disease Neg Hx   . Scoliosis Neg Hx   . Severe sprains Neg Hx     Social History   Tobacco Use  . Smoking status: Never Smoker  . Smokeless tobacco: Never Used  Substance Use Topics  . Alcohol use: No  . Drug use: No    Home Medications Prior to Admission medications     Medication Sig Start Date End Date Taking? Authorizing Provider  fluconazole (DIFLUCAN) 200 MG tablet Take 1 tablet (200 mg total) by mouth daily for 1 day. Take 1 tablet today. Repeat dose in 1 week if symptoms persist. 07/17/20 07/18/20  Tacy Learn, PA-C  oseltamivir (TAMIFLU) 75 MG capsule Take 1 capsule (75 mg total) by mouth every 12 (twelve) hours. Patient not taking: Reported on 12/02/2017 10/09/17   Noemi Chapel, MD  Prenatal Vit-Fe Fumarate-FA (PRENATAL VITAMIN PO) Take by mouth.    [provider]    Allergies    Patient has no known allergies.  Review of Systems   Review of Systems  Constitutional: Negative for fever.  Gastrointestinal: Negative for abdominal pain.  Genitourinary: Negative for dysuria, pelvic pain, vaginal bleeding, vaginal discharge and vaginal pain.  Musculoskeletal: Negative for back pain.  Skin: Negative for rash and wound.  Allergic/Immunologic: Negative for immunocompromised state.  Hematological: Negative for adenopathy.  Physical Exam Updated Vital Signs BP 107/74 (BP Location: Left Arm)   Pulse 63   Temp 98.3 F (36.8 C) (Oral)   Resp 12   Ht 5\' 5"  (1.651 m)   Wt 72.6 kg   LMP 06/12/2020   SpO2 99%   BMI 26.63 kg/m   Physical Exam Vitals and nursing note reviewed. Exam conducted with a chaperone present.  Constitutional:      General: She is not in acute distress.    Appearance: She is well-developed. She is not diaphoretic.  HENT:     Head: Normocephalic and atraumatic.  Pulmonary:     Effort: Pulmonary effort is normal.  Abdominal:     Palpations: Abdomen is soft.     Tenderness: There is no abdominal tenderness.  Genitourinary:    Labia:        Right: No rash, tenderness or lesion.        Left: No rash, tenderness or lesion.      Vagina: Normal. No vaginal discharge.     Cervix: No cervical motion tenderness or discharge.  Skin:    General: Skin is warm and dry.  Neurological:     Mental Status: She is  alert and oriented to person, place, and time.  Psychiatric:        Behavior: Behavior normal.     ED Results / Procedures / Treatments   Labs (all labs ordered are listed, but only abnormal results are displayed) Labs Reviewed  WET PREP, GENITAL - Abnormal; Notable for the following components:      Result Value   Yeast Wet Prep HPF POC PRESENT (*)    WBC, Wet Prep HPF POC FEW (*)    All other components within normal limits  URINALYSIS, ROUTINE W REFLEX MICROSCOPIC - Abnormal; Notable for the following components:   Hgb urine dipstick SMALL (*)    Leukocytes,Ua TRACE (*)    All other components within normal limits  RPR  HIV ANTIBODY (ROUTINE TESTING W REFLEX)  POC URINE PREG, ED  GC/CHLAMYDIA PROBE AMP ([place]) NOT AT [facility]    EKG None  Radiology No results found.  Procedures Procedures (including critical care time)  Medications Ordered in ED Medications - No data to display  ED Course  I have reviewed the triage vital signs and the nursing notes.  Pertinent labs & imaging results that were available during my care of the patient were reviewed by me and considered in my medical decision making (see chart for details).  Clinical Course as of Jul 17 1434  Tue Jul 18, 1639  4328 39 year old female presents with complaint of vaginal itching with concern for STDs due to exposure (condom fell off) with new sexual contact.  Exam is unremarkable. Urinalysis with small hemoglobin and trace leukocytes, no urinary symptoms.  Wet prep pending.  Advised patient to follow-up in her MyChart for her GC chlamydia/HIV/RPR results.   [LM]  1435 Wet prep is positive for yeast, will treat with Diflucan.  Denies possibility of pregnancy.   [LM]    Clinical Course User Index [LM] Roque Lias   MDM Rules/Calculators/A&P                          Final Clinical Impression(s) / ED Diagnoses Final diagnoses:  Vaginal yeast infection    Rx / DC Orders ED Discharge  Orders         Ordered    fluconazole (DIFLUCAN)  200 MG tablet  Daily       Note to Pharmacy: Please print instructions in Spanish   07/17/20 1334           Tacy Learn, PA-C 07/17/20 1436    Maudie Flakes, MD 07/17/20 1524

## 2020-07-17 NOTE — Discharge Instructions (Addendum)
Take Diflucan tablet today. Repeat dose in 1 week if you are still itching. Follow up in your mychart for your remaining test results (gonorrhea, chlamydia, HIV, syphilis). Recheck with your doctor or health department as needed.

## 2020-07-17 NOTE — Clinical Social Work Note (Signed)
Transition of Care [facility]) - Emergency Department Mini Assessment  Patient Details  Name: Andrea Ochoa MRN: [id number] Date of Birth: May 27, 1981  Transition of Care [facility]) CM/SW Contact:    Sherie Don, LCSW Phone Number: 07/17/2020, 1:02 PM  Clinical Narrative: Patient is a 39 year old female who presented to the ED for vaginal itching/burning. Per chart review, patient does not have a PCP or insurance. CSW spoke with patient to discuss referral to financial counselor to screen for Medicaid or other financial assistance program and to [facility] for PCP assistance. Patient agreeable to both referrals. CSW called [facility] to make referral. CSW made referral to financial counselor, Tito Dine. TOC signing off.  ED Mini Assessment: What brought you to the Emergency Department? : Vaginal itching/burning Barriers to Discharge: Inadequate or no insurance, ED PCP establishment Barrier interventions: Referral to [facility] and financial counselor Means of departure: Car Interventions which prevented an admission or readmission: Other (must enter comment) (Referrals to [facility] and financial counselor)  Patient Adult nurse 1: [facility] Key Contact 2: Financial counselor Tito Dine) Contact Date: 07/17/20     Call outcome: Referrals made Choice offered to / list presented to : Patient  Admission diagnosis:  VAG ITCHING/BURNING Patient Active Problem List   Diagnosis Date Noted  . [redacted] weeks gestation of pregnancy   . Advanced maternal age in multigravida, second trimester   . Fractured lateral malleolus 10/26/2013  . Pain in joint, ankle and foot 10/26/2013   PCP:  Patient, No Pcp Per Pharmacy:   [address] [place] [address] Phone: [phone] Fax: [phone]

## 2020-07-17 NOTE — ED Triage Notes (Signed)
Pt with vaginal itching and burning for a week after sexual contact when the condom came off.  Pt noted some spotting as well.  Pt denies any lesions or sores. Pt denies any vaginal discharge.

## 2020-07-18 LAB — RPR
RPR Ser Ql: REACTIVE — AB
RPR Titer: 1:1 {titer}

## 2020-07-18 LAB — GC/CHLAMYDIA PROBE AMP (~~LOC~~) NOT AT ARMC
Chlamydia: NEGATIVE
Comment: NEGATIVE
Comment: NORMAL
Neisseria Gonorrhea: NEGATIVE

## 2020-07-19 LAB — T.PALLIDUM AB, TOTAL: T Pallidum Abs: NONREACTIVE

## 2022-08-17 ENCOUNTER — Other Ambulatory Visit: Payer: Self-pay

## 2022-08-17 ENCOUNTER — Emergency Department (HOSPITAL_COMMUNITY): Payer: Self-pay

## 2022-08-17 ENCOUNTER — Emergency Department (HOSPITAL_COMMUNITY)
Admission: EM | Admit: 2022-08-17 | Discharge: 2022-08-18 | Disposition: A | Discharge status: Home / Self Care | Payer: Self-pay | Attending: Emergency Medicine | Admitting: Emergency Medicine

## 2022-08-17 ENCOUNTER — Encounter (HOSPITAL_COMMUNITY): Payer: Self-pay | Admitting: *Deleted

## 2022-08-17 DIAGNOSIS — E876 Hypokalemia: Secondary | ICD-10-CM | POA: Insufficient documentation

## 2022-08-17 DIAGNOSIS — F12929 Cannabis use, unspecified with intoxication, unspecified: Secondary | ICD-10-CM | POA: Insufficient documentation

## 2022-08-17 DIAGNOSIS — R000 Tachycardia, unspecified: Secondary | ICD-10-CM | POA: Insufficient documentation

## 2022-08-17 LAB — TROPONIN I (HIGH SENSITIVITY): Troponin I (High Sensitivity): <2 ng/L (ref <18)

## 2022-08-17 LAB — BASIC METABOLIC PANEL
Anion gap: 13 (ref 5–15)
BUN: 10 mg/dL (ref 6–20)
CO2: 18 mmol/L — ABNORMAL LOW (ref 22–32)
Calcium: 9.3 mg/dL (ref 8.9–10.3)
Chloride: 105 mmol/L (ref 98–111)
Creatinine, Ser: 0.74 mg/dL (ref 0.44–1.00)
GFR, Estimated: >60 mL/min (ref >60)
Glucose, Bld: 143 mg/dL — ABNORMAL HIGH (ref 70–99)
Potassium: 2.7 mmol/L — CL (ref 3.5–5.1)
Sodium: 136 mmol/L (ref 135–145)

## 2022-08-17 LAB — I-STAT BETA HCG BLOOD, ED (MC, WL, AP ONLY): I-stat hCG, quantitative: <5 m[IU]/mL (ref <5)

## 2022-08-17 LAB — CBC
HCT: 38.8 % (ref 36.0–46.0)
Hemoglobin: 13.2 g/dL (ref 12.0–15.0)
MCH: 29.4 pg (ref 26.0–34.0)
MCHC: 34 g/dL (ref 30.0–36.0)
MCV: 86.4 fL (ref 80.0–100.0)
Platelets: 213 10*3/uL (ref 150–400)
RBC: 4.49 MIL/uL (ref 3.87–5.11)
RDW: 12.6 % (ref 11.5–15.5)
WBC: 13.4 10*3/uL — ABNORMAL HIGH (ref 4.0–10.5)
nRBC: 0 % (ref 0.0–0.2)

## 2022-08-17 LAB — CBG MONITORING, ED: Glucose-Capillary: 135 mg/dL — ABNORMAL HIGH (ref 70–99)

## 2022-08-17 MED ORDER — POTASSIUM CHLORIDE 10 MEQ/100ML IV SOLN: 10.0000 meq | INTRAVENOUS | Status: DC

## 2022-08-17 MED ORDER — POTASSIUM CHLORIDE CRYS ER 20 MEQ PO TBCR
40.0000 meq | EXTENDED_RELEASE_TABLET | Freq: Once | ORAL | Status: AC
  Administered 2022-08-18: 40 meq via ORAL
  Filled 2022-08-17: qty 2

## 2022-08-17 MED ORDER — ONDANSETRON HCL 4 MG/2ML IJ SOLN
4.0000 mg | Freq: Once | INTRAMUSCULAR | Status: AC
  Administered 2022-08-17: 4 mg via INTRAVENOUS
  Filled 2022-08-17: qty 2

## 2022-08-17 MED ORDER — LORAZEPAM 2 MG/ML IJ SOLN
1.0000 mg | Freq: Once | INTRAMUSCULAR | Status: AC
  Administered 2022-08-17: 1 mg via INTRAVENOUS
  Filled 2022-08-17: qty 1

## 2022-08-17 MED ORDER — POTASSIUM CHLORIDE IN NACL 20-0.9 MEQ/L-% IV SOLN
INTRAVENOUS | Status: AC
  Filled 2022-08-17: qty 1000

## 2022-08-17 MED ORDER — LACTATED RINGERS IV BOLUS
1000.0000 mL | Freq: Once | INTRAVENOUS | Status: AC
  Administered 2022-08-17: 1000 mL via INTRAVENOUS

## 2022-08-17 NOTE — ED Triage Notes (Signed)
Pt with sob and heart racing since taking one gummy of "[place]" THC of 150 mg.  Pt states she took about an hour and half ago.

## 2022-08-18 LAB — RAPID URINE DRUG SCREEN, HOSP PERFORMED
Amphetamines: NOT DETECTED
Barbiturates: NOT DETECTED
Benzodiazepines: NOT DETECTED
Cocaine: NOT DETECTED
Opiates: NOT DETECTED
Tetrahydrocannabinol: POSITIVE — AB

## 2022-08-18 NOTE — ED Notes (Signed)
Pt ambulatory to bathroom with minimal assistance  

## 2022-08-18 NOTE — ED Provider Notes (Incomplete)
[facility] EMERGENCY DEPARTMENT Provider Note   CSN: [id number] Arrival date & time: 08/17/22  2131     History {Add pertinent medical, surgical, social history, OB history to HPI:1} Chief Complaint  Patient presents with  . Ingestion    Zeenat Jeanbaptiste is a 41 y.o. female.  HPI     Home Medications Prior to Admission medications   Medication Sig Start Date End Date Taking? Authorizing Provider  oseltamivir (TAMIFLU) 75 MG capsule Take 1 capsule (75 mg total) by mouth every 12 (twelve) hours. Patient not taking: Reported on 12/02/2017 10/09/17   Noemi Chapel, MD  Prenatal Vit-Fe Fumarate-FA (PRENATAL VITAMIN PO) Take by mouth.    [provider]      Allergies    Patient has no known allergies.    Review of Systems   Review of Systems  Physical Exam Updated Vital Signs BP 125/70   Pulse 90   Temp 98.1 F (36.7 C) (Oral)   Resp 16   Ht '5\' 5"'$  (1.651 m)   Wt 70.3 kg   LMP  (LMP Unknown)   SpO2 100%   BMI 25.79 kg/m  Physical Exam  ED Results / Procedures / Treatments   Labs (all labs ordered are listed, but only abnormal results are displayed) Labs Reviewed  BASIC METABOLIC PANEL - Abnormal; Notable for the following components:      Result Value   Potassium 2.7 (*)    CO2 18 (*)    Glucose, Bld 143 (*)    All other components within normal limits  CBC - Abnormal; Notable for the following components:   WBC 13.4 (*)    All other components within normal limits  CBG MONITORING, ED - Abnormal; Notable for the following components:   Glucose-Capillary 135 (*)    All other components within normal limits  RAPID URINE DRUG SCREEN, HOSP PERFORMED  I-STAT BETA HCG BLOOD, ED ([facility], [facility], [facility] ONLY)  TROPONIN I (HIGH SENSITIVITY)    EKG EKG Interpretation  Date/Time:  Sunday August 17 2022 21:45:22 EST Ventricular Rate:  118 PR Interval:  104 QRS Duration: 81 QT Interval:  237 QTC Calculation: 331 R Axis:   70 Text Interpretation: Sinus  tachycardia Ventricular premature complex Repol abnrm suggests ischemia, diffuse leads Interpretation limited secondary to artifact No previous ECGs available Confirmed by Leanord Asal ([id number]) on 08/17/2022 10:04:48 PM  Radiology DG Chest Port 1 View  Result Date: 08/17/2022 CLINICAL DATA:  Chest pain and shortness of breath EXAM: PORTABLE CHEST 1 VIEW COMPARISON:  None Available. FINDINGS: The heart size and mediastinal contours are within normal limits. Both lungs are clear. The visualized skeletal structures are unremarkable. IMPRESSION: No active disease. Electronically Signed   By: Inez Catalina M.D.   On: 08/17/2022 22:27    Procedures Procedures  {Document cardiac monitor, telemetry assessment procedure when appropriate:1}  Medications Ordered in ED Medications  potassium chloride SA (KLOR-CON M) CR tablet 40 mEq (has no administration in time range)  0.9 % NaCl with KCl 20 mEq/ L  infusion ( Intravenous New Bag/Given 08/17/22 2353)  lactated ringers bolus 1,000 mL (0 mLs Intravenous Stopped 08/17/22 2342)  ondansetron (ZOFRAN) injection 4 mg (4 mg Intravenous Given 08/17/22 2225)  LORazepam (ATIVAN) injection 1 mg (1 mg Intravenous Given 08/17/22 2225)    ED Course/ Medical Decision Making/ A&P                           Medical  Decision Making Risk Prescription drug management.   ***  {Document critical care time when appropriate:1} {Document review of labs and clinical decision tools ie heart score, Chads2Vasc2 etc:1}  {Document your independent review of radiology images, and any outside records:1} {Document your discussion with family members, caretakers, and with consultants:1} {Document social determinants of health affecting pt's care:1} {Document your decision making why or why not admission, treatments were needed:1} Final Clinical Impression(s) / ED Diagnoses Final diagnoses:  Hypokalemia  Cannabis intoxication with complication ([place])    Rx / DC  Orders ED Discharge Orders     None

## 2022-08-18 NOTE — ED Notes (Signed)
Pt ambulatory to the bathroom and back to room with no assistance

## 2022-08-18 NOTE — ED Provider Notes (Signed)
[facility] EMERGENCY DEPARTMENT Provider Note   CSN: [id number] Arrival date & time: 08/17/22  2131     History  Chief Complaint  Patient presents with   Ingestion    Andrea Ochoa is a 41 y.o. female.  Patient is a 41 year old female with no significant past medical history presented to the emergency department after a THC ingestion.  Patient is here with her significant other who states that she accidentally ate a 150 mg THC gummy.  He states that she has never used THC before.  He denies any coingestions.  The patient states that she feels like she cannot breathe and is having chest pain.  She states that she has been nauseous and vomiting at home.  She denies any abdominal pain.  She denies any fevers or chills.  The history is provided by the patient and the spouse.  Ingestion       Home Medications Prior to Admission medications   Medication Sig Start Date End Date Taking? Authorizing Provider  oseltamivir (TAMIFLU) 75 MG capsule Take 1 capsule (75 mg total) by mouth every 12 (twelve) hours. Patient not taking: Reported on 12/02/2017 10/09/17   Noemi Chapel, MD  Prenatal Vit-Fe Fumarate-FA (PRENATAL VITAMIN PO) Take by mouth.    [provider]      Allergies    Patient has no known allergies.    Review of Systems   Review of Systems  Physical Exam Updated Vital Signs BP 125/70   Pulse 90   Temp 98.1 F (36.7 C) (Oral)   Resp 16   Ht '5\' 5"'$  (1.651 m)   Wt 70.3 kg   LMP  (LMP Unknown)   SpO2 100%   BMI 25.79 kg/m  Physical Exam Vitals and nursing note reviewed.  Constitutional:      Comments: Anxious appearing hyperventilating on exam  HENT:     Head: Normocephalic and atraumatic.     Nose: Nose normal.     Mouth/Throat:     Mouth: Mucous membranes are moist.     Pharynx: Oropharynx is clear.  Eyes:     Extraocular Movements: Extraocular movements intact.     Pupils: Pupils are equal, round, and reactive to light.     Comments:  Pupils 6 mm bilaterally  Cardiovascular:     Rate and Rhythm: Regular rhythm. Tachycardia present.     Pulses: Normal pulses.     Heart sounds: Normal heart sounds.  Pulmonary:     Effort: Pulmonary effort is normal. No respiratory distress.     Breath sounds: Normal breath sounds.  Abdominal:     General: Abdomen is flat.     Palpations: Abdomen is soft.     Tenderness: There is no abdominal tenderness.  Musculoskeletal:        General: Normal range of motion.     Cervical back: Normal range of motion and neck supple.     Right lower leg: No edema.     Left lower leg: No edema.  Skin:    General: Skin is warm and dry.  Neurological:     General: No focal deficit present.     Mental Status: She is alert and oriented to person, place, and time.     Sensory: No sensory deficit.     Motor: No weakness.  Psychiatric:     Comments: Anxious appearing     ED Results / Procedures / Treatments   Labs (all labs ordered are listed, but only abnormal results are  displayed) Labs Reviewed  BASIC METABOLIC PANEL - Abnormal; Notable for the following components:      Result Value   Potassium 2.7 (*)    CO2 18 (*)    Glucose, Bld 143 (*)    All other components within normal limits  CBC - Abnormal; Notable for the following components:   WBC 13.4 (*)    All other components within normal limits  CBG MONITORING, ED - Abnormal; Notable for the following components:   Glucose-Capillary 135 (*)    All other components within normal limits  RAPID URINE DRUG SCREEN, HOSP PERFORMED  I-STAT BETA HCG BLOOD, ED ([facility], [facility], [facility] ONLY)  TROPONIN I (HIGH SENSITIVITY)    EKG EKG Interpretation  Date/Time:  Sunday August 17 2022 21:45:22 EST Ventricular Rate:  118 PR Interval:  104 QRS Duration: 81 QT Interval:  237 QTC Calculation: 331 R Axis:   70 Text Interpretation: Sinus tachycardia Ventricular premature complex Repol abnrm suggests ischemia, diffuse leads Interpretation limited  secondary to artifact No previous ECGs available Confirmed by Leanord Asal ([id number]) on 08/17/2022 10:04:48 PM  Radiology DG Chest Port 1 View  Result Date: 08/17/2022 CLINICAL DATA:  Chest pain and shortness of breath EXAM: PORTABLE CHEST 1 VIEW COMPARISON:  None Available. FINDINGS: The heart size and mediastinal contours are within normal limits. Both lungs are clear. The visualized skeletal structures are unremarkable. IMPRESSION: No active disease. Electronically Signed   By: Inez Catalina M.D.   On: 08/17/2022 22:27    Procedures Procedures    Medications Ordered in ED Medications  potassium chloride SA (KLOR-CON M) CR tablet 40 mEq (has no administration in time range)  0.9 % NaCl with KCl 20 mEq/ L  infusion ( Intravenous New Bag/Given 08/17/22 2353)  lactated ringers bolus 1,000 mL (0 mLs Intravenous Stopped 08/17/22 2342)  ondansetron (ZOFRAN) injection 4 mg (4 mg Intravenous Given 08/17/22 2225)  LORazepam (ATIVAN) injection 1 mg (1 mg Intravenous Given 08/17/22 2225)    ED Course/ Medical Decision Making/ A&P Clinical Course as of 08/18/22 0006  Mon Aug 18, 2022  0006 Patient signed out to Dr. Betsey Holiday pending sobriety. [VK]    Clinical Course User Index [VK] Kemper Durie, DO                           Medical Decision Making This patient presents to the ED with chief complaint(s) of THC ingestion with no pertinent past medical history which further complicates the presenting complaint. The complaint involves an extensive differential diagnosis and also carries with it a high risk of complications and morbidity.    The differential diagnosis includes intoxication, arrhythmia, electrolyte abnormality, pneumothorax, pulmonary edema, pleural effusion, ACS  Additional history obtained: Additional history obtained from family Records reviewed N/A  ED Course and Reassessment: On patient's arrival to the emergency department she was anxious appearing,  hyperventilating and tachycardic on exam, likely due to the effects of her intoxication.  She will be given Ativan for anxiolysis and Zofran for her nausea and started on IV fluids.  EKG on arrival showed sinus tachycardia.  She will have labs performed and will be closely monitored for sobriety.  Independent labs interpretation:  The following labs were independently interpreted: Hypokalemia otherwise within normal range  Independent visualization of imaging: - I independently visualized the following imaging with scope of interpretation limited to determining acute life threatening conditions related to emergency care: Chest x-ray, which revealed no acute disease  Risk Prescription drug management.          Final Clinical Impression(s) / ED Diagnoses Final diagnoses:  Hypokalemia  Cannabis intoxication with complication [facility])    Rx / DC Orders ED Discharge Orders     None         Kemper Durie, DO 08/18/22 0006
